# Patient Record
Sex: Male | Born: 1985 | Race: Black or African American | Hispanic: No | Marital: Single | Smoking: Current some day smoker
Health system: Southern US, Community
[De-identification: ages and names within clinical notes are randomized; demographics above are authoritative.]

## PROBLEM LIST (undated history)

## (undated) ENCOUNTER — Emergency Department: Discharge status: Absent without leave | Payer: No Typology Code available for payment source

## (undated) DIAGNOSIS — J45909 Unspecified asthma, uncomplicated: Secondary | ICD-10-CM

---
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
## 2009-08-14 ENCOUNTER — Emergency Department: Payer: Self-pay | Admitting: Emergency Medicine

## 2010-03-14 ENCOUNTER — Emergency Department: Payer: Self-pay | Admitting: Emergency Medicine

## 2011-05-08 ENCOUNTER — Emergency Department: Payer: Self-pay | Admitting: Emergency Medicine

## 2012-04-22 ENCOUNTER — Emergency Department: Payer: Self-pay | Admitting: Emergency Medicine

## 2012-06-12 ENCOUNTER — Emergency Department: Payer: Self-pay | Admitting: Emergency Medicine

## 2012-10-23 ENCOUNTER — Emergency Department: Payer: Self-pay | Admitting: Emergency Medicine

## 2013-10-13 ENCOUNTER — Emergency Department: Payer: Self-pay | Admitting: Emergency Medicine

## 2014-03-15 ENCOUNTER — Emergency Department: Payer: Self-pay | Admitting: Emergency Medicine

## 2015-02-09 ENCOUNTER — Encounter: Payer: Self-pay | Admitting: Emergency Medicine

## 2015-02-09 ENCOUNTER — Emergency Department: Payer: Worker's Compensation

## 2015-02-09 ENCOUNTER — Emergency Department
Admission: EM | Admit: 2015-02-09 | Discharge: 2015-02-09 | Disposition: A | Discharge status: Home / Self Care | Payer: Worker's Compensation | Attending: Emergency Medicine | Admitting: Emergency Medicine

## 2015-02-09 DIAGNOSIS — W2209XA Striking against other stationary object, initial encounter: Secondary | ICD-10-CM | POA: Diagnosis not present

## 2015-02-09 DIAGNOSIS — Y9389 Activity, other specified: Secondary | ICD-10-CM | POA: Insufficient documentation

## 2015-02-09 DIAGNOSIS — Y9289 Other specified places as the place of occurrence of the external cause: Secondary | ICD-10-CM | POA: Diagnosis not present

## 2015-02-09 DIAGNOSIS — Y990 Civilian activity done for income or pay: Secondary | ICD-10-CM | POA: Diagnosis not present

## 2015-02-09 DIAGNOSIS — Z79899 Other long term (current) drug therapy: Secondary | ICD-10-CM | POA: Diagnosis not present

## 2015-02-09 DIAGNOSIS — S0181XA Laceration without foreign body of other part of head, initial encounter: Secondary | ICD-10-CM | POA: Diagnosis present

## 2015-02-09 DIAGNOSIS — S0083XA Contusion of other part of head, initial encounter: Secondary | ICD-10-CM

## 2015-02-09 MED ORDER — CEPHALEXIN 500 MG PO CAPS: 500.0000 mg | ORAL_CAPSULE | Freq: Three times a day (TID) | ORAL | Status: DC

## 2015-02-09 NOTE — Discharge Instructions (Signed)
RETURN TO ER IF ANY SEVERE WORSENING OR INFECTION FOLLOW UP WITH [facility] IF ANY  CONTINUED PROBLEMS

## 2015-02-09 NOTE — ED Notes (Signed)
Patient to ED with bandaged laceration to right forehead as well as small abrasion to nose. Patient reports happened this morning around 4:30 at work.

## 2015-02-09 NOTE — ED Provider Notes (Signed)
[facility] Emergency Department Provider Note ____________________________________________  Time seen: Approximately 6:43 PM  I have reviewed the triage vital signs and the nursing notes.   HISTORY  Chief Complaint Facial Laceration   HPI Ernest Novak is a 29 y.o. male is here for laceration to his right forehead as well as an abrasion to his nose. He states it happened at work approximately 4:30 AM approximately 15 hours ago. He states he has had a tetanus shot within the last 5 years. He he denies any loss of consciousness during this event. He states he bumped his forehead and nose against a metal bin.  He denies any nosebleed at that time. He does state that there is a "large bump on his nose". He states he has not taken any medication for this.   No past medical history on file.  There are no active problems to display for this patient.   No past surgical history on file.  Current Outpatient Rx  Name  Route  Sig  Dispense  Refill  . cephALEXin (KEFLEX) 500 MG capsule   Oral   Take 1 capsule (500 mg total) by mouth 3 (three) times daily.   30 capsule   0     Allergies Review of patient's allergies indicates no known allergies.  No family history on file.  Social History Social History  Substance Use Topics  . Smoking status: Never Smoker   . Smokeless tobacco: Not on file  . Alcohol Use: No    Review of Systems Constitutional: No fever/chills Eyes: No visual changes. ENT: No sore throat. Cardiovascular: Denies chest pain. Respiratory: Denies shortness of breath. Gastrointestinal: No abdominal pain.  No nausea, no vomiting. Genitourinary: Negative for dysuria. Musculoskeletal: Negative for back pain. Skin: Negative for rash. Positive laceration Neurological: Negative for headaches, focal weakness or numbness.  10-point ROS otherwise negative.  ____________________________________________   PHYSICAL EXAM:  VITAL  SIGNS: ED Triage Vitals  Enc Vitals Group     BP 02/09/15 1751 135/83 mmHg     Pulse Rate 02/09/15 1751 63     Resp 02/09/15 1751 20     Temp 02/09/15 1751 98 F (36.7 C)     Temp Source 02/09/15 1751 Oral     SpO2 02/09/15 1751 99 %     Weight 02/09/15 1751 148 lb (67.132 kg)     Height 02/09/15 1751 5\' 10"  (1.778 m)     Head Cir --      Peak Flow --      Pain Score 02/09/15 1752 5     Pain Loc --      Pain Edu? --      Excl. in GC? --     Constitutional: Alert and oriented. Well appearing and in no acute distress. Eyes: Conjunctivae are normal. PERRL. EOMI. Head: Atraumatic.  Nose: No congestion/rhinnorhea. Tenderness at the bridge of the nose. No blood is noted Neck: No stridor.  No cervical spine tenderness on palpation posteriorly Cardiovascular: Normal rate, regular rhythm. Grossly normal heart sounds.  Good peripheral circulation. Respiratory: Normal respiratory effort.  No retractions. Lungs CTAB. Gastrointestinal: Soft and nontender. No distention. Musculoskeletal: No lower extremity tenderness nor edema.  No joint effusions. Neurologic:  Normal speech and language. No gross focal neurologic deficits are appreciated. No gait instability. Skin:  Skin is warm, dry.  There is a 1 cm laceration to the temporal area right side without active bleeding. This is very superficial in appearance. No foreign body  noted Psychiatric: Mood and affect are normal. Speech and behavior are normal.  ____________________________________________   LABS (all labs ordered are listed, but only abnormal results are displayed)  Labs Reviewed - No data to display   RADIOLOGY  Nasal bones per radiologist was negative for fracture ____________________________________________   PROCEDURES  Procedure(s) performed: None  Critical Care performed: No  ____________________________________________   INITIAL IMPRESSION / ASSESSMENT AND PLAN / ED COURSE  Pertinent labs & imaging results  that were available during my care of the patient were reviewed by me and considered in my medical decision making (see chart for details).  Patient was told there was no fracture of his nose. He is to keep tears or signs of a fall off on their own. He may use Tylenol as needed for pain. ____________________________________________   FINAL CLINICAL IMPRESSION(S) / ED DIAGNOSES  Final diagnoses:  Laceration of face, initial encounter  Contusion of face, initial encounter      Tommi Rumps, PA-C 02/09/15 2022  Myrna Blazer, MD 02/09/15 2241

## 2015-11-20 ENCOUNTER — Emergency Department
Admission: EM | Admit: 2015-11-20 | Discharge: 2015-11-20 | Disposition: A | Discharge status: Home / Self Care | Payer: BLUE CROSS/BLUE SHIELD | Attending: Emergency Medicine | Admitting: Emergency Medicine

## 2015-11-20 ENCOUNTER — Encounter: Payer: Self-pay | Admitting: Emergency Medicine

## 2015-11-20 DIAGNOSIS — R52 Pain, unspecified: Secondary | ICD-10-CM | POA: Insufficient documentation

## 2015-11-20 DIAGNOSIS — J209 Acute bronchitis, unspecified: Secondary | ICD-10-CM | POA: Diagnosis not present

## 2015-11-20 DIAGNOSIS — Z79899 Other long term (current) drug therapy: Secondary | ICD-10-CM | POA: Diagnosis not present

## 2015-11-20 DIAGNOSIS — R0602 Shortness of breath: Secondary | ICD-10-CM | POA: Diagnosis present

## 2015-11-20 DIAGNOSIS — F17210 Nicotine dependence, cigarettes, uncomplicated: Secondary | ICD-10-CM | POA: Diagnosis not present

## 2015-11-20 MED ORDER — GUAIFENESIN-CODEINE 100-10 MG/5ML PO SYRP: 5.0000 mL | ORAL_SOLUTION | Freq: Three times a day (TID) | ORAL | Status: DC | PRN

## 2015-11-20 MED ORDER — ALBUTEROL SULFATE HFA 108 (90 BASE) MCG/ACT IN AERS: 2.0000 | INHALATION_SPRAY | Freq: Four times a day (QID) | RESPIRATORY_TRACT | Status: DC | PRN

## 2015-11-20 MED ORDER — AZITHROMYCIN 250 MG PO TABS: ORAL_TABLET | ORAL | Status: DC

## 2015-11-20 NOTE — ED Provider Notes (Signed)
[facility] Emergency Department Provider Note  ____________________________________________  Time seen: Approximately 10:43 PM  I have reviewed the triage vital signs and the nursing notes.   HISTORY  Chief Complaint Nasal Congestion and Generalized Body Aches   HPI Ernest Novak is a 30 y.o. male who presents to the emergency department for evaluation of cough and cold symptoms for the past week. Cough has gotten progressively worse and he has noticed some wheezing. Children with recent illness similar to his. He denies fever. He does smoke cigars daily. He has not taken any OTC medications.   History reviewed. No pertinent past medical history.  There are no active problems to display for this patient.   History reviewed. No pertinent past surgical history.  Current Outpatient Rx  Name  Route  Sig  Dispense  Refill  . albuterol (PROVENTIL HFA;VENTOLIN HFA) 108 (90 Base) MCG/ACT inhaler   Inhalation   Inhale 2 puffs into the lungs every 6 (six) hours as needed for wheezing or shortness of breath.   1 Inhaler   2   . azithromycin (ZITHROMAX) 250 MG tablet      2 tablets today, then 1 tablet for the next 4 days.   6 each   0   . cephALEXin (KEFLEX) 500 MG capsule   Oral   Take 1 capsule (500 mg total) by mouth 3 (three) times daily.   30 capsule   0   . guaiFENesin-codeine (ROBITUSSIN AC) 100-10 MG/5ML syrup   Oral   Take 5 mLs by mouth 3 (three) times daily as needed for cough.   120 mL   0     Allergies Review of patient's allergies indicates no known allergies.  History reviewed. No pertinent family history.  Social History Social History  Substance Use Topics  . Smoking status: Current Some Day Smoker    Types: Cigars  . Smokeless tobacco: Never Used  . Alcohol Use: No    Review of Systems Constitutional: Negative fever/chills ENT: Positive for sore throat. Cardiovascular: Denies chest pain. Respiratory: Positive for  shortness of breath. Positive for cough. Gastrointestinal: Negative for nausea,  no vomiting.  Negative for diarrhea.  Musculoskeletal: Positive for body aches Skin: Negative for rash. Neurological: Positive for headaches ____________________________________________   PHYSICAL EXAM:  VITAL SIGNS: ED Triage Vitals  Enc Vitals Group     BP 11/20/15 2238 124/80 mmHg     Pulse Rate 11/20/15 2238 77     Resp 11/20/15 2238 16     Temp 11/20/15 2238 98.5 F (36.9 C)     Temp Source 11/20/15 2238 Oral     SpO2 11/20/15 2238 100 %     Weight 11/20/15 2238 150 lb (68.04 kg)     Height 11/20/15 2238 5\' 11"  (1.803 m)     Head Cir --      Peak Flow --      Pain Score 11/20/15 2238 6     Pain Loc --      Pain Edu? --      Excl. in GC? --     Constitutional: Alert and oriented. Well appearing and in no acute distress. Eyes: Conjunctivae are normal. EOMI. Ears: Normal  Nose: Maxillary sinus congestion; no rhinnorhea. Mouth/Throat: Mucous membranes are moist.  Oropharynx mildly erythematous. Tonsils appear normal. Neck: No stridor.  Lymphatic: No cervical lymphadenopathy. Cardiovascular: Normal rate, regular rhythm. Grossly normal heart sounds.  Good peripheral circulation. Respiratory: Normal respiratory effort.  No retractions. Scattered rhonchi  and expiratory wheezing throughout. Gastrointestinal: Soft and nontender.  Musculoskeletal: FROM x 4 extremities.  Neurologic:  Normal speech and language.  Skin:  Skin is warm, dry and intact. No rash noted. Psychiatric: Mood and affect are normal. Speech and behavior are normal.  ____________________________________________   LABS (all labs ordered are listed, but only abnormal results are displayed)  Labs Reviewed - No data to display ____________________________________________  EKG   ____________________________________________  RADIOLOGY   ____________________________________________   PROCEDURES  Procedure(s)  performed:   Critical Care performed:   ____________________________________________   INITIAL IMPRESSION / ASSESSMENT AND PLAN / ED COURSE  Pertinent labs & imaging results that were available during my care of the patient were reviewed by me and considered in my medical decision making (see chart for details).   Patient advised to follow up with the PCP of his choice for symptoms that are not improving over the week. He was encouraged to return to the emergency department for symptoms that change or worsen. He was given prescriptions for azithromycin, Robitussin-AC, and albuterol. ____________________________________________   FINAL CLINICAL IMPRESSION(S) / ED DIAGNOSES  Final diagnoses:  Acute bronchitis, unspecified organism       Chinita Pester, FNP 11/20/15 2302  Jeanmarie Plant, MD 11/25/15 [phone]

## 2015-11-20 NOTE — ED Notes (Signed)
Pt c/o generalized body aches with cough since last week. Pt states he has been taking day cold and flu with no relief.  Denies any SOB.

## 2015-11-20 NOTE — ED Notes (Signed)
Pt ambulatory with steady gait c/o headache and general body aches; pt talking on cell phone during entire registration process;

## 2015-11-20 NOTE — Discharge Instructions (Signed)

## 2015-11-20 NOTE — ED Notes (Signed)
Pt states a week of nasal congestion and generalized body aches. Pt appears in no acute distress in triage. Pt denies vomiting, diarrhea. Pt children have had similar symptoms.

## 2016-01-05 ENCOUNTER — Encounter: Payer: Self-pay | Admitting: *Deleted

## 2016-01-05 ENCOUNTER — Emergency Department
Admission: EM | Admit: 2016-01-05 | Discharge: 2016-01-05 | Disposition: A | Discharge status: Home / Self Care | Payer: Worker's Compensation | Attending: Emergency Medicine | Admitting: Emergency Medicine

## 2016-01-05 DIAGNOSIS — Y998 Other external cause status: Secondary | ICD-10-CM | POA: Insufficient documentation

## 2016-01-05 DIAGNOSIS — Y929 Unspecified place or not applicable: Secondary | ICD-10-CM | POA: Insufficient documentation

## 2016-01-05 DIAGNOSIS — X500XXA Overexertion from strenuous movement or load, initial encounter: Secondary | ICD-10-CM | POA: Diagnosis not present

## 2016-01-05 DIAGNOSIS — Y9389 Activity, other specified: Secondary | ICD-10-CM | POA: Insufficient documentation

## 2016-01-05 DIAGNOSIS — F17210 Nicotine dependence, cigarettes, uncomplicated: Secondary | ICD-10-CM | POA: Insufficient documentation

## 2016-01-05 DIAGNOSIS — R1030 Lower abdominal pain, unspecified: Secondary | ICD-10-CM | POA: Diagnosis present

## 2016-01-05 DIAGNOSIS — Z792 Long term (current) use of antibiotics: Secondary | ICD-10-CM | POA: Diagnosis not present

## 2016-01-05 DIAGNOSIS — S39011A Strain of muscle, fascia and tendon of abdomen, initial encounter: Secondary | ICD-10-CM | POA: Insufficient documentation

## 2016-01-05 DIAGNOSIS — Z79899 Other long term (current) drug therapy: Secondary | ICD-10-CM | POA: Insufficient documentation

## 2016-01-05 LAB — COMPREHENSIVE METABOLIC PANEL
ALK PHOS: 56 U/L (ref 38–126)
ALT: 18 U/L (ref 17–63)
ANION GAP: 6 (ref 5–15)
AST: 26 U/L (ref 15–41)
Albumin: 4.8 g/dL (ref 3.5–5.0)
BILIRUBIN TOTAL: 1 mg/dL (ref 0.3–1.2)
BUN: 20 mg/dL (ref 6–20)
CALCIUM: 9 mg/dL (ref 8.9–10.3)
CO2: 26 mmol/L (ref 22–32)
CREATININE: 0.92 mg/dL (ref 0.61–1.24)
Chloride: 104 mmol/L (ref 101–111)
Glucose, Bld: 105 mg/dL — ABNORMAL HIGH (ref 65–99)
Potassium: 3.8 mmol/L (ref 3.5–5.1)
Sodium: 136 mmol/L (ref 135–145)
TOTAL PROTEIN: 8.1 g/dL (ref 6.5–8.1)

## 2016-01-05 LAB — CBC
HCT: 41.9 % (ref 40.0–52.0)
Hemoglobin: 14.7 g/dL (ref 13.0–18.0)
MCH: 31.3 pg (ref 26.0–34.0)
MCHC: 35.1 g/dL (ref 32.0–36.0)
MCV: 89.1 fL (ref 80.0–100.0)
PLATELETS: 173 10*3/uL (ref 150–440)
RBC: 4.7 MIL/uL (ref 4.40–5.90)
RDW: 13.3 % (ref 11.5–14.5)
WBC: 6.6 10*3/uL (ref 3.8–10.6)

## 2016-01-05 LAB — URINALYSIS COMPLETE WITH MICROSCOPIC (ARMC ONLY)
Bacteria, UA: NONE SEEN
Bilirubin Urine: NEGATIVE
Glucose, UA: NEGATIVE mg/dL
Hgb urine dipstick: NEGATIVE
Ketones, ur: NEGATIVE mg/dL
Leukocytes, UA: NEGATIVE
Nitrite: NEGATIVE
PROTEIN: NEGATIVE mg/dL
SQUAMOUS EPITHELIAL / LPF: NONE SEEN
Specific Gravity, Urine: 1.01 (ref 1.005–1.030)
WBC UA: NONE SEEN WBC/hpf (ref 0–5)
pH: 6 (ref 5.0–8.0)

## 2016-01-05 LAB — LIPASE, BLOOD: Lipase: 26 U/L (ref 11–51)

## 2016-01-05 NOTE — Discharge Instructions (Signed)

## 2016-01-05 NOTE — ED Notes (Signed)
Workers comp done

## 2016-01-05 NOTE — ED Notes (Signed)
Pt to ED with abd pain while at work today. Pt does heavy lifting at work, thus when pain began. Pt denies n/v/d, pt states just "hurting bad" Pt ambulatory to triage with NAD noted.

## 2016-01-05 NOTE — ED Provider Notes (Signed)
[facility] Emergency Department Provider Note  ____________________________________________  Time seen: Approximately 5:09 AM  I have reviewed the triage vital signs and the nursing notes.   HISTORY  Chief Complaint Abdominal Pain    HPI Ernest Novak is a 30 y.o. male who is otherwise healthy and presents for evaluation of abdominal pain that started acutely yesterday while at work.  He was lifting something heavy when he felt something pull in his lower abdominal muscles and he has had pain since then.  He describes as an aching pain that is worse when he flexes his abdomen or tries to lift or pull anything.  He denies fever/chills, chest pain, shortness of breath, nausea, vomiting, diarrhea, dysuria, scrotal pain.  He has had no bulging in the groin or the abdomen.  He has not had any difficulty with bowel movements.   History reviewed. No pertinent past medical history.  There are no active problems to display for this patient.   History reviewed. No pertinent past surgical history.  Current Outpatient Rx  Name  Route  Sig  Dispense  Refill  . albuterol (PROVENTIL HFA;VENTOLIN HFA) 108 (90 Base) MCG/ACT inhaler   Inhalation   Inhale 2 puffs into the lungs every 6 (six) hours as needed for wheezing or shortness of breath.   1 Inhaler   2   . azithromycin (ZITHROMAX) 250 MG tablet      2 tablets today, then 1 tablet for the next 4 days.   6 each   0   . cephALEXin (KEFLEX) 500 MG capsule   Oral   Take 1 capsule (500 mg total) by mouth 3 (three) times daily.   30 capsule   0   . guaiFENesin-codeine (ROBITUSSIN AC) 100-10 MG/5ML syrup   Oral   Take 5 mLs by mouth 3 (three) times daily as needed for cough.   120 mL   0     Allergies Review of patient's allergies indicates no known allergies.  History reviewed. No pertinent family history.  Social History Social History  Substance Use Topics  . Smoking status: Current Some Day  Smoker    Types: Cigars  . Smokeless tobacco: Never Used  . Alcohol Use: No    Review of Systems Constitutional: No fever/chills Eyes: No visual changes. ENT: No sore throat. Cardiovascular: Denies chest pain. Respiratory: Denies shortness of breath. Gastrointestinal: Lower abdominal wall pain.  No nausea, no vomiting.  No diarrhea.  No constipation. Genitourinary: Negative for dysuria. Musculoskeletal: Negative for back pain. Skin: Negative for rash. Neurological: Negative for headaches, focal weakness or numbness.  10-point ROS otherwise negative.  ____________________________________________   PHYSICAL EXAM:  ED Triage Vitals  Enc Vitals Group     BP 01/05/16 0529 122/74 mmHg     Pulse Rate 01/05/16 0529 54     Resp 01/05/16 0529 16     Temp 01/05/16 0529 98 F (36.7 C)     Temp Source 01/05/16 0529 Oral     SpO2 01/05/16 0529 99 %     Weight --      Height --      Head Cir --      Peak Flow --      Pain Score 01/05/16 0235 8     Pain Loc --      Pain Edu? --      Excl. in GC? --      Constitutional: Alert and oriented. Well appearing and in no acute distress.  Eyes: Conjunctivae are normal. PERRL. EOMI. Head: Atraumatic. Cardiovascular: Normal rate, regular rhythm. Good peripheral circulation. Grossly normal heart sounds.   Respiratory: Normal respiratory effort.  No retractions. Lungs CTAB. Gastrointestinal: Soft and nontender. Reproducible pain/tenderness when the patient does a stomach crunch.   Musculoskeletal: No lower extremity tenderness nor edema. No gross deformities of extremities. Neurologic:  Normal speech and language. No gross focal neurologic deficits are appreciated.  Skin:  Skin is warm, dry and intact. No rash noted. Psychiatric: Mood and affect are normal. Speech and behavior are normal.  ____________________________________________   LABS (all labs ordered are listed, but only abnormal results are displayed)  Labs Reviewed    COMPREHENSIVE METABOLIC PANEL - Abnormal; Notable for the following:    Glucose, Bld 105 (*)    All other components within normal limits  URINALYSIS COMPLETEWITH MICROSCOPIC ([facility] ONLY) - Abnormal; Notable for the following:    Color, Urine STRAW (*)    APPearance CLEAR (*)    All other components within normal limits  LIPASE, BLOOD  CBC   ____________________________________________  EKG  None ____________________________________________  RADIOLOGY   No results found.  ____________________________________________   PROCEDURES  Procedure(s) performed:   Procedures   ____________________________________________   INITIAL IMPRESSION / ASSESSMENT AND PLAN / ED COURSE  Pertinent labs & imaging results that were available during my care of the patient were reviewed by me and considered in my medical decision making (see chart for details).  The patient appears to pulse of muscles in his abdominal wall.  He has no significant tenderness to palpation and only has pain or tenderness when he flexes his muscles just to sit up or do a crunch.  I suggested light duty for a couple of days and outpatient follow-up with over-the-counter pain medicine.  There is no indication for imaging at this time.  I gave my usual and customary return precautions.      ____________________________________________  FINAL CLINICAL IMPRESSION(S) / ED DIAGNOSES  Final diagnoses:  Abdominal wall strain, initial encounter     MEDICATIONS GIVEN DURING THIS VISIT:  Medications - No data to display   NEW OUTPATIENT MEDICATIONS STARTED DURING THIS VISIT:  New Prescriptions   No medications on file      Note:  This document was prepared using Dragon voice recognition software and may include unintentional dictation errors.   Loleta Rose, MD 01/05/16 [phone]

## 2016-01-05 NOTE — ED Notes (Signed)
Pt reports abd pain since yesterday - Pt reports that the pain started after lifting heavy material at work - Pain is at the bottom of pt abd - pt denies urinary frequency or painful urination

## 2016-05-31 ENCOUNTER — Emergency Department
Admission: EM | Admit: 2016-05-31 | Discharge: 2016-05-31 | Disposition: A | Discharge status: Home / Self Care | Payer: BLUE CROSS/BLUE SHIELD | Attending: Emergency Medicine | Admitting: Emergency Medicine

## 2016-05-31 DIAGNOSIS — F17290 Nicotine dependence, other tobacco product, uncomplicated: Secondary | ICD-10-CM | POA: Insufficient documentation

## 2016-05-31 DIAGNOSIS — K047 Periapical abscess without sinus: Secondary | ICD-10-CM

## 2016-05-31 DIAGNOSIS — Z79899 Other long term (current) drug therapy: Secondary | ICD-10-CM | POA: Insufficient documentation

## 2016-05-31 DIAGNOSIS — J45909 Unspecified asthma, uncomplicated: Secondary | ICD-10-CM | POA: Insufficient documentation

## 2016-05-31 HISTORY — DX: Unspecified asthma, uncomplicated: J45.909

## 2016-05-31 MED ORDER — LIDOCAINE VISCOUS 2 % MT SOLN
15.0000 mL | Freq: Once | OROMUCOSAL | Status: AC
  Administered 2016-05-31: 15 mL via OROMUCOSAL

## 2016-05-31 MED ORDER — LIDOCAINE VISCOUS 2 % MT SOLN: 10.0000 mL | OROMUCOSAL | 0 refills | Status: DC | PRN

## 2016-05-31 MED ORDER — NAPROXEN 500 MG PO TABS: 500.0000 mg | ORAL_TABLET | Freq: Two times a day (BID) | ORAL | 0 refills | Status: DC

## 2016-05-31 MED ORDER — AMOXICILLIN 500 MG PO TABS: 500.0000 mg | ORAL_TABLET | Freq: Three times a day (TID) | ORAL | 0 refills | Status: DC

## 2016-05-31 MED ORDER — LIDOCAINE VISCOUS 2 % MT SOLN
OROMUCOSAL | Status: AC
  Filled 2016-05-31: qty 15

## 2016-05-31 NOTE — ED Triage Notes (Signed)
Pt presents to ED with c/o dental pain. Pt states pain started Monday and reports pain is located on the lower left side of his jaw. Pt denies N/V or fever. Pt reports taking Ibuprofen regularly without relief.

## 2016-05-31 NOTE — ED Provider Notes (Signed)
[facility] Emergency Department Provider Note  ____________________________________________  Time seen: Approximately 8:49 PM  I have reviewed the triage vital signs and the nursing notes.   HISTORY  Chief Complaint Dental Pain    HPI Krista Bluearon T Buening is a 30 y.o. male , NAD, presents to the emergency department with 1 week history of left lower dental pain. Patient states he has had dental pain for 1 week this seems to be worsening. Has been taking over-the-counter medications without any relief of the pain. Has not contacted a dentist for care. Eyes any injury or trauma to the face or neck. Has no broken teeth. Has not noted any oozing or weeping. Denies any swelling about the face or neck. Has had no fevers, chills or body aches. No abdominal pain, nausea or vomiting. No difficulty swallowing or breathing.   Past Medical History:  Diagnosis Date  . Asthma     There are no active problems to display for this patient.   History reviewed. No pertinent surgical history.  Prior to Admission medications   Medication Sig Start Date End Date Taking? Authorizing Provider  albuterol (PROVENTIL HFA;VENTOLIN HFA) 108 (90 Base) MCG/ACT inhaler Inhale 2 puffs into the lungs every 6 (six) hours as needed for wheezing or shortness of breath. 11/20/15   Cari B Triplett, FNP  amoxicillin (AMOXIL) 500 MG tablet Take 1 tablet (500 mg total) by mouth 3 (three) times daily with meals. 05/31/16   Jami L Hagler, PA-C  azithromycin (ZITHROMAX) 250 MG tablet 2 tablets today, then 1 tablet for the next 4 days. 11/20/15   Chinita Pesterari B Triplett, FNP  cephALEXin (KEFLEX) 500 MG capsule Take 1 capsule (500 mg total) by mouth 3 (three) times daily. 02/09/15   Tommi Rumpshonda L Summers, PA-C  guaiFENesin-codeine (ROBITUSSIN AC) 100-10 MG/5ML syrup Take 5 mLs by mouth 3 (three) times daily as needed for cough. 11/20/15   Chinita Pesterari B Triplett, FNP  lidocaine (XYLOCAINE) 2 % solution Use as directed 10 mLs in the  mouth or throat every 4 (four) hours as needed for mouth pain. 05/31/16   Jami L Hagler, PA-C  naproxen (NAPROSYN) 500 MG tablet Take 1 tablet (500 mg total) by mouth 2 (two) times daily with a meal. 05/31/16   Jami L Hagler, PA-C    Allergies Patient has no known allergies.  No family history on file.  Social History Social History  Substance Use Topics  . Smoking status: Current Some Day Smoker    Types: Cigars  . Smokeless tobacco: Never Used  . Alcohol use No     Review of Systems  Constitutional: No fever/chills ENT: Positive dental pain. Respiratory: No shortness of breath. No wheezing.  Gastrointestinal: No abdominal pain.  No nausea, vomiting.   Musculoskeletal: Negative for neck pain.  Skin: Negative for rash, redness or swelling. Neurological: Negative for headaches  ____________________________________________   PHYSICAL EXAM:  VITAL SIGNS: ED Triage Vitals  Enc Vitals Group     BP 05/31/16 2002 (!) 144/88     Pulse Rate 05/31/16 2002 61     Resp 05/31/16 2002 16     Temp 05/31/16 2002 99.4 F (37.4 C)     Temp Source 05/31/16 2002 Oral     SpO2 05/31/16 2002 98 %     Weight 05/31/16 2000 150 lb (68 kg)     Height 05/31/16 2000 5\' 10"  (1.778 m)     Head Circumference --      Peak Flow --  Pain Score 05/31/16 2000 10     Pain Loc --      Pain Edu? --      Excl. in GC? --      Constitutional: Alert and oriented. Well appearing and in no acute distress. Eyes: Conjunctivae are normal.  Head: Atraumatic. ENT:      Nose: No congestion/rhinnorhea.      Mouth/Throat: Left lower gum line with mild erythema and mild swelling but no active oozing or weeping. Tender to palpation of the left lower, posterior gumline. Poor dentition throughout. Mucous membranes are moist. Pharynx without erythema, swelling, exudate. Uvula is midline. Airways patent. Neck: Supple with full range of motion Hematological/Lymphatic/Immunilogical: No cervical  lymphadenopathy. Cardiovascular: Good peripheral circulation. Respiratory: Normal respiratory effort without tachypnea or retractions.  Musculoskeletal: No lower extremity tenderness nor edema.  No joint effusions. Neurologic:  Normal speech and language. No gross focal neurologic deficits are appreciated.  Skin:  Skin is warm, dry and intact. No rash noted. Psychiatric: Mood and affect are normal. Speech and behavior are normal. Patient exhibits appropriate insight and judgement.   ____________________________________________   LABS  None ____________________________________________  EKG  None ____________________________________________  RADIOLOGY  None ____________________________________________    PROCEDURES  Procedure(s) performed: None   Procedures   Medications  lidocaine (XYLOCAINE) 2 % viscous mouth solution 15 mL (15 mLs Mouth/Throat Given 05/31/16 2107)     ____________________________________________   INITIAL IMPRESSION / ASSESSMENT AND PLAN / ED COURSE  Pertinent labs & imaging results that were available during my care of the patient were reviewed by me and considered in my medical decision making (see chart for details).  Clinical Course     Patient's diagnosis is consistent with Dental abscess causing dental pain. Patient will be discharged home with prescriptions for amoxicillin, Naprosyn and lidocaine viscus to use as directed. Patient is to [facility] [place] for [place] care if symptoms persist past this treatment course. Patient is given ED precautions to return to the ED for any worsening or new symptoms.    ____________________________________________  FINAL CLINICAL IMPRESSION(S) / ED DIAGNOSES  Final diagnoses:  Dental abscess      NEW MEDICATIONS STARTED DURING THIS VISIT:  Discharge Medication List as of 05/31/2016  8:51 PM    START taking these medications   Details  amoxicillin  (AMOXIL) 500 MG tablet Take 1 tablet (500 mg total) by mouth 3 (three) times daily with meals., Starting Thu 05/31/2016, Print    lidocaine (XYLOCAINE) 2 % solution Use as directed 10 mLs in the mouth or throat every 4 (four) hours as needed for mouth pain., Starting Thu 05/31/2016, Print    naproxen (NAPROSYN) 500 MG tablet Take 1 tablet (500 mg total) by mouth 2 (two) times daily with a meal., Starting Thu 05/31/2016, Print             Hope PigeonJami L Hagler, PA-C 05/31/16 2229    Nita Sicklearolina Veronese, MD 06/03/16 1722

## 2017-12-30 ENCOUNTER — Other Ambulatory Visit: Payer: Self-pay

## 2017-12-30 ENCOUNTER — Encounter: Payer: Self-pay | Admitting: Emergency Medicine

## 2017-12-30 DIAGNOSIS — K0889 Other specified disorders of teeth and supporting structures: Secondary | ICD-10-CM | POA: Insufficient documentation

## 2017-12-30 DIAGNOSIS — Z5321 Procedure and treatment not carried out due to patient leaving prior to being seen by health care provider: Secondary | ICD-10-CM | POA: Insufficient documentation

## 2017-12-31 ENCOUNTER — Emergency Department
Admission: EM | Admit: 2017-12-31 | Discharge: 2017-12-31 | Disposition: A | Discharge status: Home / Self Care | Payer: BLUE CROSS/BLUE SHIELD | Attending: Emergency Medicine | Admitting: Emergency Medicine

## 2017-12-31 NOTE — ED Notes (Signed)
No answer when called several times from lobby 

## 2020-02-27 ENCOUNTER — Emergency Department: Payer: No Typology Code available for payment source

## 2020-02-27 ENCOUNTER — Other Ambulatory Visit: Payer: Self-pay

## 2020-02-27 ENCOUNTER — Emergency Department
Admission: EM | Admit: 2020-02-27 | Discharge: 2020-02-27 | Disposition: A | Discharge status: Home / Self Care | Payer: No Typology Code available for payment source | Attending: Emergency Medicine | Admitting: Emergency Medicine

## 2020-02-27 DIAGNOSIS — M25561 Pain in right knee: Secondary | ICD-10-CM | POA: Diagnosis not present

## 2020-02-27 DIAGNOSIS — Y999 Unspecified external cause status: Secondary | ICD-10-CM | POA: Diagnosis not present

## 2020-02-27 DIAGNOSIS — Y92410 Unspecified street and highway as the place of occurrence of the external cause: Secondary | ICD-10-CM | POA: Insufficient documentation

## 2020-02-27 DIAGNOSIS — Z79899 Other long term (current) drug therapy: Secondary | ICD-10-CM | POA: Diagnosis not present

## 2020-02-27 DIAGNOSIS — R519 Headache, unspecified: Secondary | ICD-10-CM | POA: Insufficient documentation

## 2020-02-27 DIAGNOSIS — Y939 Activity, unspecified: Secondary | ICD-10-CM | POA: Insufficient documentation

## 2020-02-27 DIAGNOSIS — J45909 Unspecified asthma, uncomplicated: Secondary | ICD-10-CM | POA: Insufficient documentation

## 2020-02-27 DIAGNOSIS — M542 Cervicalgia: Secondary | ICD-10-CM | POA: Insufficient documentation

## 2020-02-27 DIAGNOSIS — F17290 Nicotine dependence, other tobacco product, uncomplicated: Secondary | ICD-10-CM | POA: Diagnosis not present

## 2020-02-27 MED ORDER — MELOXICAM 15 MG PO TABS: 15.0000 mg | ORAL_TABLET | Freq: Every day | ORAL | 0 refills | Status: AC

## 2020-02-27 MED ORDER — MELOXICAM 7.5 MG PO TABS
15.0000 mg | ORAL_TABLET | Freq: Once | ORAL | Status: AC
  Administered 2020-02-27: 15 mg via ORAL
  Filled 2020-02-27: qty 2

## 2020-02-27 MED ORDER — ACETAMINOPHEN 325 MG PO TABS
650.0000 mg | ORAL_TABLET | Freq: Once | ORAL | Status: AC
  Administered 2020-02-27: 650 mg via ORAL
  Filled 2020-02-27: qty 2

## 2020-02-27 MED ORDER — CYCLOBENZAPRINE HCL 5 MG PO TABS: 5.0000 mg | ORAL_TABLET | Freq: Three times a day (TID) | ORAL | 0 refills | Status: AC | PRN

## 2020-02-27 MED ORDER — CYCLOBENZAPRINE HCL 10 MG PO TABS
5.0000 mg | ORAL_TABLET | Freq: Once | ORAL | Status: AC
  Administered 2020-02-27: 5 mg via ORAL
  Filled 2020-02-27: qty 1
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
## 2020-04-05 ENCOUNTER — Inpatient Hospital Stay: Payer: Self-pay | Admitting: Nurse Practitioner

## 2020-07-31 ENCOUNTER — Encounter: Payer: Self-pay | Admitting: Intensive Care

## 2020-07-31 ENCOUNTER — Other Ambulatory Visit: Payer: Self-pay

## 2020-07-31 ENCOUNTER — Emergency Department
Admission: EM | Admit: 2020-07-31 | Discharge: 2020-07-31 | Disposition: A | Discharge status: Home / Self Care | Payer: Self-pay | Attending: Emergency Medicine | Admitting: Emergency Medicine

## 2020-07-31 DIAGNOSIS — F17290 Nicotine dependence, other tobacco product, uncomplicated: Secondary | ICD-10-CM | POA: Insufficient documentation

## 2020-07-31 DIAGNOSIS — J45909 Unspecified asthma, uncomplicated: Secondary | ICD-10-CM | POA: Insufficient documentation

## 2020-07-31 DIAGNOSIS — R300 Dysuria: Secondary | ICD-10-CM | POA: Insufficient documentation

## 2020-07-31 DIAGNOSIS — Z202 Contact with and (suspected) exposure to infections with a predominantly sexual mode of transmission: Secondary | ICD-10-CM | POA: Insufficient documentation

## 2020-07-31 DIAGNOSIS — R369 Urethral discharge, unspecified: Secondary | ICD-10-CM | POA: Insufficient documentation

## 2020-07-31 LAB — URINALYSIS, COMPLETE (UACMP) WITH MICROSCOPIC
Bilirubin Urine: NEGATIVE
Glucose, UA: NEGATIVE mg/dL
Ketones, ur: NEGATIVE mg/dL
Nitrite: NEGATIVE
Protein, ur: NEGATIVE mg/dL
Specific Gravity, Urine: 1.012 (ref 1.005–1.030)
WBC, UA: >50 WBC/hpf — ABNORMAL HIGH (ref 0–5)
pH: 6 (ref 5.0–8.0)

## 2020-07-31 LAB — CHLAMYDIA/NGC RT PCR (ARMC ONLY)
Chlamydia Tr: NOT DETECTED
N gonorrhoeae: DETECTED — AB

## 2020-07-31 MED ORDER — CEFTRIAXONE SODIUM 1 G IJ SOLR
500.0000 mg | Freq: Once | INTRAMUSCULAR | Status: AC
  Administered 2020-07-31: 500 mg via INTRAMUSCULAR
  Filled 2020-07-31: qty 10

## 2020-07-31 MED ORDER — DOXYCYCLINE HYCLATE 100 MG PO TABS: 100.0000 mg | ORAL_TABLET | Freq: Two times a day (BID) | ORAL | 0 refills | Status: DC

## 2020-07-31 NOTE — ED Notes (Signed)
Pt verbalized understanding of d/c instructions at this time. Pt denied further questions. Pt ambulatory to lobby at this time with mom.

## 2020-07-31 NOTE — ED Triage Notes (Signed)
Patient c/o burning during urination and discharge from penis. Reports having unprotected sex recently

## 2020-07-31 NOTE — ED Provider Notes (Signed)
[facility] Emergency Department Provider Note  ____________________________________________  Time seen: Approximately 6:24 PM  I have reviewed the triage vital signs and the nursing notes.   HISTORY  Chief Complaint Exposure to STD    HPI Ernest Novak is a 35 y.o. male who presents the emergency department complaining of dysuria and possible penile lesion.  Patient states that he has had dysuria and penile discharge x2 days.  He is concerned for STD specifically gonorrhea or chlamydia.  He is unsure whether his partner has been tested or symptomatic.  No suprapubic pain.  No polyuria.  No hematuria.  Patient has never had an STD in the past.  Patient is also concerned as he has a lesion to the underside of the penis.  States it is nonpainful.  He states that he had a sugar removal from the site after hunting several months ago.  It is in the exact same location and he is unsure whether this may have just been the mark from the removal versus a new lesion.  Again no pain.         Past Medical History:  Diagnosis Date  . Asthma     There are no problems to display for this patient.   History reviewed. No pertinent surgical history.  Prior to Admission medications   Medication Sig Start Date End Date Taking? Authorizing Provider  doxycycline (VIBRA-TABS) 100 MG tablet Take 1 tablet (100 mg total) by mouth 2 (two) times daily. 07/31/20  Yes Emmylou Bieker, Delorise Royals, PA-C  albuterol (PROVENTIL HFA;VENTOLIN HFA) 108 (90 Base) MCG/ACT inhaler Inhale 2 puffs into the lungs every 6 (six) hours as needed for wheezing or shortness of breath. 11/20/15   Triplett, Cari B, FNP  amoxicillin (AMOXIL) 500 MG tablet Take 1 tablet (500 mg total) by mouth 3 (three) times daily with meals. 05/31/16   Hagler, Jami L, PA-C  azithromycin (ZITHROMAX) 250 MG tablet 2 tablets today, then 1 tablet for the next 4 days. 11/20/15   Triplett, Cari B, FNP  cephALEXin (KEFLEX) 500 MG capsule  Take 1 capsule (500 mg total) by mouth 3 (three) times daily. 02/09/15   Tommi Rumps, PA-C  guaiFENesin-codeine (ROBITUSSIN AC) 100-10 MG/5ML syrup Take 5 mLs by mouth 3 (three) times daily as needed for cough. 11/20/15   Triplett, Cari B, FNP  lidocaine (XYLOCAINE) 2 % solution Use as directed 10 mLs in the mouth or throat every 4 (four) hours as needed for mouth pain. 05/31/16   Hagler, Jami L, PA-C    Allergies Patient has no known allergies.  History reviewed. No pertinent family history.  Social History Social History   Tobacco Use  . Smoking status: Current Some Day Smoker    Types: Cigars  . Smokeless tobacco: Never Used  Substance Use Topics  . Alcohol use: Yes    Alcohol/week: 9.0 standard drinks    Types: 6 Cans of beer, 3 Shots of liquor per week  . Drug use: No     Review of Systems  Constitutional: No fever/chills Eyes: No visual changes. No discharge ENT: No upper respiratory complaints. Cardiovascular: no chest pain. Respiratory: no cough. No SOB. Gastrointestinal: No abdominal pain.  No nausea, no vomiting.  No diarrhea.  No constipation. Genitourinary: Positive for dysuria and penile discharge.  Possible lesion to the underside of the penis.  No hematuria Musculoskeletal: Negative for musculoskeletal pain. Skin: Negative for rash, abrasions, lacerations, ecchymosis. Neurological: Negative for headaches, focal weakness or numbness.  10 System ROS otherwise negative.  ____________________________________________   PHYSICAL EXAM:  VITAL SIGNS: ED Triage Vitals [07/31/20 1731]  Enc Vitals Group     BP (!) 147/104     Pulse Rate 80     Resp 16     Temp 98.4 F (36.9 C)     Temp Source Oral     SpO2 99 %     Weight 160 lb (72.6 kg)     Height 6' (1.829 m)     Head Circumference      Peak Flow      Pain Score 0     Pain Loc      Pain Edu?      Excl. in GC?      Constitutional: Alert and oriented. Well appearing and in no acute  distress. Eyes: Conjunctivae are normal. PERRL. EOMI. Head: Atraumatic. ENT:      Ears:       Nose: No congestion/rhinnorhea.      Mouth/Throat: Mucous membranes are moist.  Neck: No stridor.    Cardiovascular: Normal rate, regular rhythm. Normal S1 and S2.  Good peripheral circulation. Respiratory: Normal respiratory effort without tachypnea or retractions. Lungs CTAB. Good air entry to the bases with no decreased or absent breath sounds. Gastrointestinal: Bowel sounds 4 quadrants. Soft and nontender to palpation. No guarding or rigidity. No palpable masses. No distention. No CVA tenderness. Genitourinary: Exam of the penile shaft reveals a small punctate lesion which appears to be more scar tissue than true chancre or lesion.  No surrounding erythema or edema.  No other acute visible abnormality. Musculoskeletal: Full range of motion to all extremities. No gross deformities appreciated. Neurologic:  Normal speech and language. No gross focal neurologic deficits are appreciated.  Skin:  Skin is warm, dry and intact. No rash noted. Psychiatric: Mood and affect are normal. Speech and behavior are normal. Patient exhibits appropriate insight and judgement.   ____________________________________________   LABS (all labs ordered are listed, but only abnormal results are displayed)  Labs Reviewed  CHLAMYDIA/NGC RT PCR ([facility] ONLY) - Abnormal; Notable for the following components:      Result Value   N gonorrhoeae DETECTED (*)    All other components within normal limits  URINALYSIS, COMPLETE (UACMP) WITH MICROSCOPIC - Abnormal; Notable for the following components:   Color, Urine YELLOW (*)    APPearance CLOUDY (*)    Hgb urine dipstick SMALL (*)    Leukocytes,Ua LARGE (*)    WBC, UA >50 (*)    Bacteria, UA RARE (*)    All other components within normal limits  RPR    ____________________________________________  EKG   ____________________________________________  RADIOLOGY   No results found.  ____________________________________________    PROCEDURES  Procedure(s) performed:    Procedures    Medications  cefTRIAXone (ROCEPHIN) injection 500 mg (500 mg Intramuscular Given 07/31/20 2003)     ____________________________________________   INITIAL IMPRESSION / ASSESSMENT AND PLAN / ED COURSE  Pertinent labs & imaging results that were available during my care of the patient were reviewed by me and considered in my medical decision making (see chart for details).  Review of the [place] CSRS was performed in accordance of the [facility] prior to dispensing any controlled drugs.           Patient's diagnosis is consistent with dysuria, possible exposure to STD.  Patient presented to the emergency department with symptoms consistent with gonorrhea, chlamydia or nongonococcal urethritis.  Patient was treated empirically  after urinalysis had returned.  Ultimately gonorrhea test would return positive but again patient has received appropriate treatment.  Patient also had a penile lesion which she would like examined.  It was nontender and patient denied any pain.  It appears that this is a scar from a previous insect bite removal.  However given the lesion, I did draw RPR.  Patient is aware on how to check his results.  If RPR is positive he will return for penicillin shot..  Follow-up primary care as needed.  Patient is given ED precautions to return to the ED for any worsening or new symptoms.     ____________________________________________  FINAL CLINICAL IMPRESSION(S) / ED DIAGNOSES  Final diagnoses:  Penile discharge  Possible exposure to STD      NEW MEDICATIONS STARTED DURING THIS VISIT:  ED Discharge Orders         Ordered    doxycycline (VIBRA-TABS) 100 MG tablet  2 times daily        07/31/20 1949               This chart was dictated using voice recognition software/Dragon. Despite best efforts to proofread, errors can occur which can change the meaning. Any change was purely unintentional.    Lanette Hampshire 07/31/20 2042    Phineas Semen, MD 07/31/20 2129

## 2020-07-31 NOTE — ED Notes (Signed)
Pt called and updated on lab results as well as care needed for follow-up. Pt provides verbal understanding.

## 2020-07-31 NOTE — ED Notes (Signed)
Pt to ED c/o pain and burning with urination that started 2d ago.. Pt concerned may have STI because was having unprotected sex recently. States has clear discharge from penis.

## 2020-08-01 ENCOUNTER — Telehealth: Payer: Self-pay | Admitting: Emergency Medicine

## 2020-08-01 LAB — RPR: RPR Ser Ql: NONREACTIVE

## 2020-08-01 NOTE — Telephone Encounter (Signed)
Called patient to inform of std result.  Says he was called last night about the gonorrhea.  His rpr has not resulted yet.

## 2022-03-08 ENCOUNTER — Emergency Department: Payer: Self-pay

## 2022-03-08 ENCOUNTER — Emergency Department
Admission: EM | Admit: 2022-03-08 | Discharge: 2022-03-08 | Disposition: A | Discharge status: Home / Self Care | Payer: Self-pay | Attending: Emergency Medicine | Admitting: Emergency Medicine

## 2022-03-08 ENCOUNTER — Other Ambulatory Visit: Payer: Self-pay

## 2022-03-08 DIAGNOSIS — S46912A Strain of unspecified muscle, fascia and tendon at shoulder and upper arm level, left arm, initial encounter: Secondary | ICD-10-CM | POA: Insufficient documentation

## 2022-03-08 DIAGNOSIS — X58XXXA Exposure to other specified factors, initial encounter: Secondary | ICD-10-CM | POA: Insufficient documentation

## 2022-03-08 LAB — COMPREHENSIVE METABOLIC PANEL
ALT: 55 U/L — ABNORMAL HIGH (ref 0–44)
AST: 41 U/L (ref 15–41)
Albumin: 3.8 g/dL (ref 3.5–5.0)
Alkaline Phosphatase: 48 U/L (ref 38–126)
Anion gap: 8 (ref 5–15)
BUN: 12 mg/dL (ref 6–20)
CO2: 23 mmol/L (ref 22–32)
Calcium: 8.8 mg/dL — ABNORMAL LOW (ref 8.9–10.3)
Chloride: 105 mmol/L (ref 98–111)
Creatinine, Ser: 0.95 mg/dL (ref 0.61–1.24)
GFR, Estimated: >60 mL/min (ref >60)
Glucose, Bld: 105 mg/dL — ABNORMAL HIGH (ref 70–99)
Potassium: 3.9 mmol/L (ref 3.5–5.1)
Sodium: 136 mmol/L (ref 135–145)
Total Bilirubin: 0.5 mg/dL (ref 0.3–1.2)
Total Protein: 7.3 g/dL (ref 6.5–8.1)

## 2022-03-08 LAB — CBC WITH DIFFERENTIAL/PLATELET
Abs Immature Granulocytes: 0.01 10*3/uL (ref 0.00–0.07)
Basophils Absolute: 0 10*3/uL (ref 0.0–0.1)
Basophils Relative: 1 %
Eosinophils Absolute: 0.1 10*3/uL (ref 0.0–0.5)
Eosinophils Relative: 2 %
HCT: 41.8 % (ref 39.0–52.0)
Hemoglobin: 14.6 g/dL (ref 13.0–17.0)
Immature Granulocytes: 0 %
Lymphocytes Relative: 26 %
Lymphs Abs: 1.7 10*3/uL (ref 0.7–4.0)
MCH: 31.7 pg (ref 26.0–34.0)
MCHC: 34.9 g/dL (ref 30.0–36.0)
MCV: 90.9 fL (ref 80.0–100.0)
Monocytes Absolute: 0.5 10*3/uL (ref 0.1–1.0)
Monocytes Relative: 8 %
Neutro Abs: 4.3 10*3/uL (ref 1.7–7.7)
Neutrophils Relative %: 63 %
Platelets: 197 10*3/uL (ref 150–400)
RBC: 4.6 MIL/uL (ref 4.22–5.81)
RDW: 12.6 % (ref 11.5–15.5)
WBC: 6.6 10*3/uL (ref 4.0–10.5)
nRBC: 0 % (ref 0.0–0.2)

## 2022-03-08 LAB — CK: Total CK: 95 U/L (ref 49–397)

## 2022-03-08 MED ORDER — IBUPROFEN 600 MG PO TABS: 600.0000 mg | ORAL_TABLET | Freq: Four times a day (QID) | ORAL | 0 refills | Status: AC | PRN

## 2022-03-08 MED ORDER — KETOROLAC TROMETHAMINE 30 MG/ML IJ SOLN
30.0000 mg | Freq: Once | INTRAMUSCULAR | Status: AC
  Administered 2022-03-08: 30 mg via INTRAMUSCULAR
  Filled 2022-03-08: qty 1

## 2022-03-08 MED ORDER — LIDOCAINE 5 % EX PTCH: 1.0000 | MEDICATED_PATCH | Freq: Two times a day (BID) | CUTANEOUS | 0 refills | Status: AC

## 2022-03-08 MED ORDER — LIDOCAINE 5 % EX PTCH
1.0000 | MEDICATED_PATCH | Freq: Once | CUTANEOUS | Status: DC
  Administered 2022-03-08: 1 via TRANSDERMAL
  Filled 2022-03-08: qty 1

## 2022-03-08 NOTE — Discharge Instructions (Addendum)
We are starting you on some anti-inflammatories and some pain patches if you develop swelling, redness or any other concerns you can return to the ER for repeat evaluation
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
## 2022-03-08 NOTE — ED Provider Notes (Signed)
[facility] Provider Note    Event Date/Time   First MD Initiated Contact with Patient 03/08/22 1100     (approximate)   History   Arm Pain   HPI  Ernest Novak is a 36 y.o. male who comes with L arm pain for 2 weeks.  Patient reports 2 weeks of intermittent left arm pain that is worse with certain movements and certain positions and laying on it.  He reports that prior to it happening he was at work when there was a tiny shock when he was moving something with his right arm that he then jerked his left arm back.  He denies any shock going into the left arm.  He states that initially he did not have any pain and he felt fine but then the next day he noticed some aching in the left arm.  He thinks it was from the movement of jerking back.  He reports taking some Tylenol and pain patches for it.  He wanted to come in and make sure there is nothing else going on given this has been ongoing for 2 weeks now.  He denies any swelling or risk factors for DVT.  Denies any numbness.     Physical Exam   Triage Vital Signs: ED Triage Vitals  Enc Vitals Group     BP 03/08/22 0948 (!) 136/95     Pulse Rate 03/08/22 0948 89     Resp 03/08/22 0948 16     Temp 03/08/22 0948 98.4 F (36.9 C)     Temp Source 03/08/22 0948 Oral     SpO2 03/08/22 0948 95 %     Weight 03/08/22 0950 161 lb (73 kg)     Height 03/08/22 0950 6' (1.829 m)     Head Circumference --      Peak Flow --      Pain Score 03/08/22 0957 10     Pain Loc --      Pain Edu? --      Excl. in GC? --     Most recent vital signs: Vitals:   03/08/22 0948  BP: (!) 136/95  Pulse: 89  Resp: 16  Temp: 98.4 F (36.9 C)  SpO2: 95%     General: Awake, no distress.  CV:  Good peripheral perfusion.  Resp:  Normal effort.  Abd:  No distention.  Other:  Patient reports tenderness mostly in the mid left humerus area and a little bit of the left shoulder.  He still able to range it but reports some pain  with certain movements.  He sensations intact in his hand.  Radial, ulnar, median nerve are all intact with hand movements.  He is got good distal pulse.  No swelling of the arm.  No redness warmth.  No lymphedema or abscess noted in the armpit.   ED Results / Procedures / Treatments   Labs (all labs ordered are listed, but only abnormal results are displayed) Labs Reviewed - No data to display    RADIOLOGY I have reviewed the xray personally and interpreted no evidence of any fractures or mass  PROCEDURES:  Critical Care performed: No  Procedures   MEDICATIONS ORDERED IN ED: Medications - No data to display   IMPRESSION / MDM / ASSESSMENT AND PLAN / ED COURSE  I reviewed the triage vital signs and the nursing notes.   Patient's presentation is most consistent with acute, uncomplicated illness.   Differential includes fracture, bone mass,  muscle strain.  No evidence of infection based upon examination.  No swelling of the arm or risk factors to suggest DVT.  X-rays were ordered that were negative and patient was given a lidocaine patch and some Toradol to help with symptoms.  CMP shows slightly elevated ALT.  He does report drinking alcohol. Denies excessive tylenol use reports only occasional use. We discussed cutting down on etoh and having a recheck with primary care doctor.  CBC normal white count.  CK level normal  On repeat evaluation patient reports feeling better.  He feels comfortable with discharge home with some ibuprofen, lidocaine patches and can return to the ER if he develops any swelling or any other concerns   FINAL CLINICAL IMPRESSION(S) / ED DIAGNOSES   Final diagnoses:  Muscle strain of left upper arm, initial encounter     Rx / DC Orders   ED Discharge Orders          Ordered    ibuprofen (ADVIL) 600 MG tablet  Every 6 hours PRN        03/08/22 1406    lidocaine (LIDODERM) 5 %  Every 12 hours        03/08/22 1406             Note:   This document was prepared using Dragon voice recognition software and may include unintentional dictation errors.   Concha Se, MD 03/08/22 [phone]
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
## 2024-03-20 ENCOUNTER — Emergency Department

## 2024-03-20 ENCOUNTER — Other Ambulatory Visit: Payer: Self-pay

## 2024-03-20 ENCOUNTER — Emergency Department
Admission: EM | Admit: 2024-03-20 | Discharge: 2024-03-20 | Disposition: A | Discharge status: Home / Self Care | Attending: Emergency Medicine | Admitting: Emergency Medicine

## 2024-03-20 DIAGNOSIS — J45901 Unspecified asthma with (acute) exacerbation: Secondary | ICD-10-CM | POA: Insufficient documentation

## 2024-03-20 DIAGNOSIS — R059 Cough, unspecified: Secondary | ICD-10-CM | POA: Diagnosis present

## 2024-03-20 LAB — RESP PANEL BY RT-PCR (RSV, FLU A&B, COVID)  RVPGX2
Influenza A by PCR: NEGATIVE
Influenza B by PCR: NEGATIVE
Resp Syncytial Virus by PCR: NEGATIVE
SARS Coronavirus 2 by RT PCR: NEGATIVE

## 2024-03-20 MED ORDER — PREDNISONE 10 MG PO TABS
50.0000 mg | ORAL_TABLET | Freq: Every day | ORAL | 0 refills | Status: AC
Start: 2024-03-20 — End: ?

## 2024-03-20 MED ORDER — IPRATROPIUM-ALBUTEROL 0.5-2.5 (3) MG/3ML IN SOLN
3.0000 mL | Freq: Once | RESPIRATORY_TRACT | Status: AC
  Administered 2024-03-20: 3 mL via RESPIRATORY_TRACT
  Filled 2024-03-20: qty 3

## 2024-03-20 MED ORDER — ALBUTEROL SULFATE HFA 108 (90 BASE) MCG/ACT IN AERS: 2.0000 | INHALATION_SPRAY | Freq: Four times a day (QID) | RESPIRATORY_TRACT | 2 refills | Status: AC | PRN

## 2024-03-20 MED ORDER — PREDNISONE 20 MG PO TABS
50.0000 mg | ORAL_TABLET | Freq: Once | ORAL | Status: AC
  Administered 2024-03-20: 50 mg via ORAL
  Filled 2024-03-20: qty 3

## 2024-03-20 NOTE — ED Triage Notes (Signed)
 Cough and congestion x 1-2days. Denies any fevers, but endorses sick contacts.

## 2024-03-20 NOTE — ED Notes (Signed)
 This NT monitored the patient's O2 saturation while ambulating. SpO2 stayed between 97 and 98% the entire time. Patient mentioned that his head starting hurting once he stood up. No acute needs at this time.

## 2024-03-20 NOTE — ED Provider Notes (Signed)
 [facility] Provider Note    Event Date/Time   First MD Initiated Contact with Patient 03/20/24 2003     (approximate)   History   Cough   HPI  Ernest Novak is a 38 y.o. male with history of asthma and as listed in EMR presents to the emergency department for treatment and evaluation of cough and congestion for the past 1 or 2 days.  No fever.  Cough is nonproductive but he has been wheezing.  He has not had to use an inhaler since he was a child.  His job requires him to be around strong chemicals and he has seasonal allergies.  He is unsure if either of these things has contributed to his current symptoms..   Physical Exam    Vitals:   03/20/24 1907  BP: (!) 167/109  Pulse: 83  Resp: 18  Temp: 98.9 F (37.2 C)  SpO2: 93%    General: Awake, no distress.  CV:  Good peripheral perfusion.  Resp:  Normal effort.  Diffuse wheezing on auscultation Abd:  No distention.  Other:     ED Results / Procedures / Treatments   Labs (all labs ordered are listed, but only abnormal results are displayed)  Labs Reviewed  RESP PANEL BY RT-PCR (RSV, FLU A&B, COVID)  RVPGX2     EKG  Not indicated   RADIOLOGY  Image and radiology report reviewed and interpreted by me. Radiology report consistent with the same.  Not indicated  PROCEDURES:  Critical Care performed: No  Procedures   MEDICATIONS ORDERED IN ED:  Medications  predniSONE  (DELTASONE ) tablet 50 mg (50 mg Oral Given 03/20/24 2056)  ipratropium-albuterol  (DUONEB) 0.5-2.5 (3) MG/3ML nebulizer solution 3 mL (3 mLs Nebulization Given 03/20/24 2039)  ipratropium-albuterol  (DUONEB) 0.5-2.5 (3) MG/3ML nebulizer solution 3 mL (3 mLs Nebulization Given 03/20/24 2136)     IMPRESSION / MDM / ASSESSMENT AND PLAN / ED COURSE   I have reviewed the triage note and vital signs. Vital signs are stable   Differential diagnosis includes, but is not limited to, asthma exacerbation, COVID,  influenza, seasonal allergies  Patient's presentation is most consistent with acute illness / injury with system symptoms.  38 year old male presenting to the emergency department for treatment and evaluation of cough and congestion for the past couple of days.  See HPI for further details.  On exam, he has diffuse wheezing on auscultation.  Prednisone  tablet and DuoNeb ordered.  Clinical Course as of 03/20/24 2305  Fri Mar 20, 2024  2117 Patient appears to be more short of breath after DuoNeb and prednisone .  Chest x-ray ordered. [CT]  2233 Breath sounds are better, but patient is still short of breath. Ambulatory saturation requested. [CT]  2300 Ambulatory saturation is in the high 90s. Plan will be to discharge patient home with prescription for prednisone  and albuterol .  He was given strict ER return precautions as well.  Work excuse provided for the next 2 days. [CT]    Clinical Course User Index [CT] Edith Groleau B, FNP     FINAL CLINICAL IMPRESSION(S) / ED DIAGNOSES   Final diagnoses:  Asthma with acute exacerbation, unspecified asthma severity, unspecified whether persistent     Rx / DC Orders   ED Discharge Orders          Ordered    albuterol  (VENTOLIN  HFA) 108 (90 Base) MCG/ACT inhaler  Every 6 hours PRN        03/20/24 2303  predniSONE  (DELTASONE ) 10 MG tablet  Daily        03/20/24 2303             Note:  This document was prepared using Dragon voice recognition software and may include unintentional dictation errors.   [name], FNP 03/20/24 7694    Bradler, Evan K, MD 03/21/24 [phone]

## 2024-03-20 NOTE — Discharge Instructions (Signed)
 Keep your inhaler with you especially while at work and use it if you start to feel short of breath or wheezing.  Return to the emergency department if your symptoms change or worsen.  I recommend establishing a primary care provider for you to see if your symptoms become recurrent.

## 2024-03-20 NOTE — ED Notes (Signed)
 Pt encouraged to establish care with PCP to address elevated BP that was noted during visit today.   Pt provided discharge instructions and prescription information. Pt was given the opportunity to ask questions and questions were answered.

## 2024-05-28 ENCOUNTER — Other Ambulatory Visit: Payer: Self-pay

## 2024-05-28 ENCOUNTER — Emergency Department
Admission: EM | Admit: 2024-05-28 | Discharge: 2024-05-28 | Disposition: A | Discharge status: Home / Self Care | Attending: Emergency Medicine | Admitting: Emergency Medicine

## 2024-05-28 DIAGNOSIS — J101 Influenza due to other identified influenza virus with other respiratory manifestations: Secondary | ICD-10-CM | POA: Diagnosis not present

## 2024-05-28 DIAGNOSIS — R059 Cough, unspecified: Secondary | ICD-10-CM | POA: Diagnosis present

## 2024-05-28 DIAGNOSIS — J45909 Unspecified asthma, uncomplicated: Secondary | ICD-10-CM | POA: Insufficient documentation

## 2024-05-28 LAB — RESP PANEL BY RT-PCR (RSV, FLU A&B, COVID)  RVPGX2
Influenza A by PCR: NEGATIVE
Influenza B by PCR: POSITIVE — AB
Resp Syncytial Virus by PCR: NEGATIVE
SARS Coronavirus 2 by RT PCR: NEGATIVE

## 2024-05-28 NOTE — ED Triage Notes (Signed)
 Pt to ED via POV from home. Pt reports cough, N/VD, chills, body aches since Monday. Unsure os sick contacts.

## 2024-05-28 NOTE — ED Provider Notes (Signed)
 [facility] Provider Note    Event Date/Time   First MD Initiated Contact with Patient 05/28/24 [phone]     (approximate)   History   Cough   HPI  Ernest Novak is a 38 y.o. male with PMH of asthma who presents for evaluation of cough and diarrhea since Monday.  Patient endorses nausea, vomiting, several episodes of diarrhea with 1 episode of vomiting.  He has also had chills and bodyaches.  Reports subjective fever but did not quantify with a thermometer.      Physical Exam   Triage Vital Signs: ED Triage Vitals  Encounter Vitals Group     BP 05/28/24 0830 (!) 145/99     Girls Systolic BP Percentile --      Girls Diastolic BP Percentile --      Boys Systolic BP Percentile --      Boys Diastolic BP Percentile --      Pulse Rate 05/28/24 0828 86     Resp 05/28/24 0828 20     Temp 05/28/24 0828 98.7 F (37.1 C)     Temp Source 05/28/24 0828 Oral     SpO2 05/28/24 0828 98 %     Weight --      Height --      Head Circumference --      Peak Flow --      Pain Score 05/28/24 0829 8     Pain Loc --      Pain Education --      Exclude from Growth Chart --     Most recent vital signs: Vitals:   05/28/24 0828 05/28/24 0830  BP:  (!) 145/99  Pulse: 86   Resp: 20   Temp: 98.7 F (37.1 C)   SpO2: 98%    General: Awake, no distress.  CV:  Good peripheral perfusion.  RRR. Resp:  Normal effort.  CTAB. Abd:  No distention.  Soft, nontender to palpation. Other:  Oral mucous membranes are moist, no pharyngeal erythema, no tonsillar enlargement or exudate.   ED Results / Procedures / Treatments   Labs (all labs ordered are listed, but only abnormal results are displayed) Labs Reviewed  RESP PANEL BY RT-PCR (RSV, FLU A&B, COVID)  RVPGX2 - Abnormal; Notable for the following components:      Result Value   Influenza B by PCR POSITIVE (*)    All other components within normal limits    PROCEDURES:  Critical Care performed:  No  Procedures   MEDICATIONS ORDERED IN ED: Medications - No data to display   IMPRESSION / MDM / ASSESSMENT AND PLAN / ED COURSE  I reviewed the triage vital signs and the nursing notes.                             38 year old male presents for evaluation of flulike symptoms with his daughter who is also sick.  Blood pressure was a little bit elevated, vital signs stable otherwise.  Patient NAD on exam.  Differential diagnosis includes, but is not limited to, flu, COVID, RSV, viral gastroenteritis.  Patient's presentation is most consistent with acute complicated illness / injury requiring diagnostic workup.  Respiratory panel was positive for influenza B.  Physical exam is overall reassuring.  Recommended over-the-counter medications for symptomatic management.  Patient was given a note for work.  We discussed return precautions.  He voiced understanding, all questions were answered and he was  stable at discharge.      FINAL CLINICAL IMPRESSION(S) / ED DIAGNOSES   Final diagnoses:  Influenza B     Rx / DC Orders   ED Discharge Orders     None        Note:  This document was prepared using Dragon voice recognition software and may include unintentional dictation errors.   Cleaster Tinnie LABOR, PA-C 05/28/24 1522    Levander Slate, MD 05/29/24 [phone]

## 2024-05-28 NOTE — Discharge Instructions (Signed)
 You tested positive for the flu today.  This is a viral illness which will resolve on its own with time.  You do not need an antibiotic.  You can take over-the-counter cold medicine as needed to manage your symptoms.  If you are taking combination cold medicine keep in mind that this often contains Tylenol so if you need additional medication for body aches or fever control please take Motrin or ibuprofen.  Your symptoms should resolve with time, if you have had symptoms for greater than 10 days please be evaluated by another healthcare provider as at this point it may have developed into a bacterial infection which requires a different treatment.  Return to the emergency department with worsening symptoms.
